# Patient Record
Sex: Male | Born: 1985 | Race: Asian | Hispanic: No | Marital: Single | Smoking: Never smoker
Health system: Southern US, Community
[De-identification: ages and names within clinical notes are randomized; demographics above are authoritative.]

---

## 2016-07-10 ENCOUNTER — Encounter (HOSPITAL_BASED_OUTPATIENT_CLINIC_OR_DEPARTMENT_OTHER): Payer: Self-pay | Admitting: *Deleted

## 2016-07-10 ENCOUNTER — Emergency Department (HOSPITAL_BASED_OUTPATIENT_CLINIC_OR_DEPARTMENT_OTHER): Payer: Self-pay

## 2016-07-10 ENCOUNTER — Inpatient Hospital Stay (HOSPITAL_BASED_OUTPATIENT_CLINIC_OR_DEPARTMENT_OTHER)
Admission: EM | Admit: 2016-07-10 | Discharge: 2016-07-12 | DRG: 378 | Disposition: A | Discharge status: Home / Self Care | Payer: Self-pay | Attending: Internal Medicine | Admitting: Internal Medicine

## 2016-07-10 DIAGNOSIS — K257 Chronic gastric ulcer without hemorrhage or perforation: Secondary | ICD-10-CM | POA: Diagnosis present

## 2016-07-10 DIAGNOSIS — I959 Hypotension, unspecified: Secondary | ICD-10-CM

## 2016-07-10 DIAGNOSIS — D72829 Elevated white blood cell count, unspecified: Secondary | ICD-10-CM

## 2016-07-10 DIAGNOSIS — R718 Other abnormality of red blood cells: Secondary | ICD-10-CM

## 2016-07-10 DIAGNOSIS — W1811XA Fall from or off toilet without subsequent striking against object, initial encounter: Secondary | ICD-10-CM | POA: Diagnosis present

## 2016-07-10 DIAGNOSIS — S01111A Laceration without foreign body of right eyelid and periocular area, initial encounter: Secondary | ICD-10-CM | POA: Diagnosis present

## 2016-07-10 DIAGNOSIS — D62 Acute posthemorrhagic anemia: Secondary | ICD-10-CM

## 2016-07-10 DIAGNOSIS — S0181XA Laceration without foreign body of other part of head, initial encounter: Secondary | ICD-10-CM

## 2016-07-10 DIAGNOSIS — K269 Duodenal ulcer, unspecified as acute or chronic, without hemorrhage or perforation: Secondary | ICD-10-CM | POA: Diagnosis present

## 2016-07-10 DIAGNOSIS — K922 Gastrointestinal hemorrhage, unspecified: Secondary | ICD-10-CM | POA: Diagnosis present

## 2016-07-10 DIAGNOSIS — Y92512 Supermarket, store or market as the place of occurrence of the external cause: Secondary | ICD-10-CM

## 2016-07-10 DIAGNOSIS — K219 Gastro-esophageal reflux disease without esophagitis: Secondary | ICD-10-CM | POA: Diagnosis present

## 2016-07-10 DIAGNOSIS — D649 Anemia, unspecified: Secondary | ICD-10-CM

## 2016-07-10 DIAGNOSIS — R55 Syncope and collapse: Secondary | ICD-10-CM

## 2016-07-10 DIAGNOSIS — K921 Melena: Principal | ICD-10-CM | POA: Diagnosis present

## 2016-07-10 LAB — CBC WITH DIFFERENTIAL/PLATELET
BASOS ABS: 0.1 10*3/uL (ref 0.0–0.1)
BASOS PCT: 1 %
Band Neutrophils: 4 %
EOS ABS: 0.3 10*3/uL (ref 0.0–0.7)
Eosinophils Relative: 2 %
HCT: 30.2 % — ABNORMAL LOW (ref 39.0–52.0)
Hemoglobin: 10.4 g/dL — ABNORMAL LOW (ref 13.0–17.0)
LYMPHS PCT: 24 %
Lymphs Abs: 3.2 10*3/uL (ref 0.7–4.0)
MCH: 29.8 pg (ref 26.0–34.0)
MCHC: 34.4 g/dL (ref 30.0–36.0)
MCV: 86.5 fL (ref 78.0–100.0)
MONO ABS: 0.8 10*3/uL (ref 0.1–1.0)
Monocytes Relative: 6 %
NEUTROS PCT: 63 %
Neutro Abs: 9 10*3/uL — ABNORMAL HIGH (ref 1.7–7.7)
PLATELETS: 206 10*3/uL (ref 150–400)
RBC: 3.49 MIL/uL — ABNORMAL LOW (ref 4.22–5.81)
RDW: 12.6 % (ref 11.5–15.5)
WBC: 13.4 10*3/uL — ABNORMAL HIGH (ref 4.0–10.5)

## 2016-07-10 LAB — HEMOGLOBIN AND HEMATOCRIT, BLOOD
HCT: 24.3 % — ABNORMAL LOW (ref 39.0–52.0)
HEMATOCRIT: 26.9 % — AB (ref 39.0–52.0)
HEMOGLOBIN: 9.1 g/dL — AB (ref 13.0–17.0)
Hemoglobin: 8.3 g/dL — ABNORMAL LOW (ref 13.0–17.0)

## 2016-07-10 LAB — COMPREHENSIVE METABOLIC PANEL
ALK PHOS: 54 U/L (ref 38–126)
ALT: 31 U/L (ref 17–63)
ANION GAP: 5 (ref 5–15)
AST: 28 U/L (ref 15–41)
Albumin: 3.5 g/dL (ref 3.5–5.0)
BUN: 35 mg/dL — ABNORMAL HIGH (ref 6–20)
CALCIUM: 8.8 mg/dL — AB (ref 8.9–10.3)
CO2: 24 mmol/L (ref 22–32)
Chloride: 107 mmol/L (ref 101–111)
Creatinine, Ser: 0.92 mg/dL (ref 0.61–1.24)
GFR calc non Af Amer: >60 mL/min (ref >60)
Glucose, Bld: 112 mg/dL — ABNORMAL HIGH (ref 65–99)
POTASSIUM: 4.3 mmol/L (ref 3.5–5.1)
SODIUM: 136 mmol/L (ref 135–145)
Total Bilirubin: 0.6 mg/dL (ref 0.3–1.2)
Total Protein: 6.3 g/dL — ABNORMAL LOW (ref 6.5–8.1)

## 2016-07-10 LAB — TROPONIN I

## 2016-07-10 LAB — URINALYSIS, ROUTINE W REFLEX MICROSCOPIC
Bilirubin Urine: NEGATIVE
Glucose, UA: NEGATIVE mg/dL
Hgb urine dipstick: NEGATIVE
KETONES UR: 15 mg/dL — AB
LEUKOCYTES UA: NEGATIVE
NITRITE: NEGATIVE
PROTEIN: NEGATIVE mg/dL
Specific Gravity, Urine: 1.03 (ref 1.005–1.030)
pH: 6 (ref 5.0–8.0)

## 2016-07-10 LAB — OCCULT BLOOD X 1 CARD TO LAB, STOOL: Fecal Occult Bld: POSITIVE — AB

## 2016-07-10 MED ORDER — TETANUS-DIPHTH-ACELL PERTUSSIS 5-2.5-18.5 LF-MCG/0.5 IM SUSP
0.5000 mL | Freq: Once | INTRAMUSCULAR | Status: AC
  Administered 2016-07-10: 0.5 mL via INTRAMUSCULAR
  Filled 2016-07-10: qty 0.5

## 2016-07-10 MED ORDER — LIDOCAINE-EPINEPHRINE (PF) 2 %-1:200000 IJ SOLN
20.0000 mL | Freq: Once | INTRAMUSCULAR | Status: AC
  Administered 2016-07-10: 20 mL
  Filled 2016-07-10: qty 20

## 2016-07-10 MED ORDER — SODIUM CHLORIDE 0.9 % IV SOLN
80.0000 mg | Freq: Once | INTRAVENOUS | Status: AC
  Administered 2016-07-10: 80 mg via INTRAVENOUS
  Filled 2016-07-10: qty 80

## 2016-07-10 MED ORDER — SODIUM CHLORIDE 0.9 % IV BOLUS (SEPSIS)
1000.0000 mL | Freq: Once | INTRAVENOUS | Status: AC
  Administered 2016-07-10: 1000 mL via INTRAVENOUS

## 2016-07-10 MED ORDER — HYDROCODONE-ACETAMINOPHEN 5-325 MG PO TABS: 1.0000 | ORAL_TABLET | ORAL | Status: DC | PRN

## 2016-07-10 MED ORDER — FAMOTIDINE IN NACL 20-0.9 MG/50ML-% IV SOLN
20.0000 mg | Freq: Two times a day (BID) | INTRAVENOUS | Status: DC
  Administered 2016-07-10: 20 mg via INTRAVENOUS
  Filled 2016-07-10 (×2): qty 50

## 2016-07-10 MED ORDER — SODIUM CHLORIDE 0.9 % IV SOLN
8.0000 mg/h | INTRAVENOUS | Status: DC
  Administered 2016-07-10 – 2016-07-11 (×3): 8 mg/h via INTRAVENOUS
  Filled 2016-07-10 (×6): qty 80

## 2016-07-10 MED ORDER — LIDOCAINE-EPINEPHRINE-TETRACAINE (LET) SOLUTION
3.0000 mL | Freq: Once | NASAL | Status: AC
  Administered 2016-07-10: 3 mL via TOPICAL
  Filled 2016-07-10: qty 3

## 2016-07-10 MED ORDER — ACETAMINOPHEN 325 MG PO TABS: 650.0000 mg | ORAL_TABLET | Freq: Four times a day (QID) | ORAL | Status: DC | PRN

## 2016-07-10 MED ORDER — SODIUM CHLORIDE 0.9 % IV SOLN
INTRAVENOUS | Status: DC
  Administered 2016-07-10 – 2016-07-12 (×4): via INTRAVENOUS

## 2016-07-10 MED ORDER — SODIUM CHLORIDE 0.9 % IV SOLN: INTRAVENOUS | Status: DC

## 2016-07-10 MED ORDER — MAGNESIUM CITRATE PO SOLN: 1.0000 | Freq: Once | ORAL | Status: DC | PRN

## 2016-07-10 MED ORDER — ONDANSETRON HCL 4 MG PO TABS: 4.0000 mg | ORAL_TABLET | Freq: Four times a day (QID) | ORAL | Status: DC | PRN

## 2016-07-10 MED ORDER — SENNOSIDES-DOCUSATE SODIUM 8.6-50 MG PO TABS: 1.0000 | ORAL_TABLET | Freq: Every evening | ORAL | Status: DC | PRN

## 2016-07-10 MED ORDER — BISACODYL 10 MG RE SUPP: 10.0000 mg | Freq: Every day | RECTAL | Status: DC | PRN

## 2016-07-10 MED ORDER — ACETAMINOPHEN 650 MG RE SUPP: 650.0000 mg | Freq: Four times a day (QID) | RECTAL | Status: DC | PRN

## 2016-07-10 MED ORDER — ONDANSETRON HCL 4 MG/2ML IJ SOLN: 4.0000 mg | Freq: Four times a day (QID) | INTRAMUSCULAR | Status: DC | PRN

## 2016-07-10 NOTE — ED Notes (Signed)
PT STATES HE DID FEEL DIZZY WHEN HE STOOD UP.

## 2016-07-10 NOTE — ED Notes (Signed)
Pt made aware of the need for a urine sample, says he will void shortly.Urinal given at this time.

## 2016-07-10 NOTE — ED Provider Notes (Signed)
[facility] Note   CSN: [id number] Arrival date & time: 07/10/16  1005     History   Chief Complaint Chief Complaint  Patient presents with  . Loss of Consciousness    HPI Aaron Dalton is a 30 y.o. male.  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  He said that he's had black stool for the past few days.  The pt has never had anything like this in the past.  The pt said that he was in the bathroom after having a black stool; he was cleaning himself up; then passed out.  Pt did sustain a lac to his forehead when he passed out.        History reviewed. No pertinent past medical history.  Patient Active Problem List   Diagnosis Date Noted  . GI bleed 07/10/2016    History reviewed. No pertinent surgical history.     Home Medications    Prior to Admission medications   Not on File    Family History No family history on file.  Social History Social History  Substance Use Topics  . Smoking status: Never Smoker  . Smokeless tobacco: Never Used  . Alcohol use Yes     Comment: several times per month     Allergies   Review of patient's allergies indicates no known allergies.   Review of Systems Review of Systems  Gastrointestinal: Positive for nausea.       Black stool  Skin: Positive for wound.  Neurological: Positive for syncope.  All other systems reviewed and are negative.    Physical Exam Updated Vital Signs BP 124/71   Pulse 84   Temp 98.4 F (36.9 C) (Oral)   Resp 17   Ht 5\' 4"  (1.626 m)   Wt 189 lb (85.7 kg)   SpO2 100%   BMI 32.44 kg/m   Physical Exam  Constitutional: He is oriented to person, place, and time. He appears well-developed and well-nourished.  HENT:  Head: Normocephalic. Head is with laceration.    Right Ear: External ear normal.  Left Ear: External ear normal.  Nose: Nose normal.  Mouth/Throat: Oropharynx is clear and moist.  Eyes: Conjunctivae and EOM are normal.  Pupils are equal, round, and reactive to light.  Neck: Normal range of motion. Neck supple.  Cardiovascular: Normal rate, regular rhythm, normal heart sounds and intact distal pulses.   Pulmonary/Chest: Effort normal and breath sounds normal.  Abdominal: Soft. Bowel sounds are normal.  Genitourinary: Rectum normal, prostate normal and penis normal.  Genitourinary Comments: Stool is black  Musculoskeletal: Normal range of motion.  Neurological: He is alert and oriented to person, place, and time.  Skin: Skin is warm and dry.  Psychiatric: He has a normal mood and affect. His behavior is normal. Judgment and thought content normal.  Nursing note and vitals reviewed.    ED Treatments / Results  Labs (all labs ordered are listed, but only abnormal results are displayed) Labs Reviewed  COMPREHENSIVE METABOLIC PANEL - Abnormal; Notable for the following:       Result Value   Glucose, Bld 112 (*)    BUN 35 (*)    Calcium 8.8 (*)    Total Protein 6.3 (*)    All other components within normal limits  CBC WITH DIFFERENTIAL/PLATELET - Abnormal; Notable for the following:    WBC 13.4 (*)    RBC 3.49 (*)    Hemoglobin 10.4 (*)  HCT 30.2 (*)    Neutro Abs 9.0 (*)    All other components within normal limits  URINALYSIS, ROUTINE W REFLEX MICROSCOPIC (NOT AT [facility]) - Abnormal; Notable for the following:    Ketones, ur 15 (*)    All other components within normal limits  OCCULT BLOOD X 1 CARD TO LAB, STOOL - Abnormal; Notable for the following:    Fecal Occult Bld POSITIVE (*)    All other components within normal limits  TROPONIN I  POC OCCULT BLOOD, ED    EKG  EKG Interpretation  Date/Time:  Saturday July 10 2016 10:24:15 EDT Ventricular Rate:  78 PR Interval:    QRS Duration: 81 QT Interval:  367 QTC Calculation: 418 R Axis:   61 Text Interpretation:  Sinus rhythm Confirmed by Justyce Baby MD, Burnard Enis ([id number]) on 07/10/2016 10:32:27 AM       Radiology Ct Head Wo  Contrast  Result Date: 07/10/2016 CLINICAL DATA:  Fall today EXAM: CT HEAD WITHOUT CONTRAST TECHNIQUE: Contiguous axial images were obtained from the base of the skull through the vertex without intravenous contrast. COMPARISON:  None. FINDINGS: Brain: No intracranial hemorrhage, mass effect or midline shift. No acute cortical infarction. No mass lesion is noted on this unenhanced scan. Vascular: No hyperdense vessel or unexpected calcification. Skull: Normal. Negative for fracture or focal lesion. There is skin irregularity probable laceration right frontal supraorbital scalp. Sinuses/Orbits: No acute finding. Other: None. IMPRESSION: No acute intracranial abnormality. Probable skin laceration right frontal supraorbital region. Electronically Signed   By: Natasha MeadLiviu  Pop M.D.   On: 07/10/2016 11:43    Procedures .Marland Kitchen.Laceration Repair Date/Time: 07/10/2016 11:56 AM Performed by: Jacalyn LefevreHAVILAND, Viviann Broyles Authorized by: Jacalyn LefevreHAVILAND, Lu Paradise   Consent:    Consent obtained:  Verbal   Consent given by:  Patient   Risks discussed:  Infection, pain, poor cosmetic result and need for additional repair   Alternatives discussed:  No treatment Anesthesia (see MAR for exact dosages):    Anesthesia method:  Local infiltration and topical application   Topical anesthetic:  LET   Local anesthetic:  Lidocaine 2% WITH epi Laceration details:    Location:  Face   Face location:  R eyebrow   Length (cm):  3 Repair type:    Repair type:  Simple Pre-procedure details:    Preparation:  Patient was prepped and draped in usual sterile fashion Exploration:    Hemostasis achieved with:  Direct pressure   Contaminated: no   Treatment:    Area cleansed with:  Betadine   Amount of cleaning:  Standard   Irrigation solution:  Sterile saline Skin repair:    Repair method:  Sutures   Suture size:  6-0   Suture material:  Prolene   Number of sutures:  8 Approximation:    Approximation:  Close   Vermilion border: well-aligned    Post-procedure details:    Dressing:  Antibiotic ointment   Patient tolerance of procedure:  Tolerated well, no immediate complications   (including critical care time)  Medications Ordered in ED Medications  famotidine (PEPCID) IVPB 20 mg premix (20 mg Intravenous New Bag/Given 07/10/16 1141)  sodium chloride 0.9 % bolus 1,000 mL (not administered)  sodium chloride 0.9 % bolus 1,000 mL (0 mLs Intravenous Stopped 07/10/16 1138)  Tdap (BOOSTRIX) injection 0.5 mL (0.5 mLs Intramuscular Given 07/10/16 1104)  lidocaine-EPINEPHrine-tetracaine (LET) solution (3 mLs Topical Given 07/10/16 1107)  lidocaine-EPINEPHrine (XYLOCAINE W/EPI) 2 %-1:200000 (PF) injection 20 mL (20 mLs Infiltration Given by Other 07/10/16 1054)  Initial Impression / Assessment and Plan / ED Course  I have reviewed the triage vital signs and the nursing notes.  Pertinent labs & imaging results that were available during my care of the patient were reviewed by me and considered in my medical decision making (see chart for details).  Clinical Course   Pt with symptomatic anemia and orthostatic with a GI bleed.  He has no PCP and no f/u.   Pt d/w Dr. York Spaniel ([facility]) who [facility] stay.     Final Clinical Impressions(s) / ED Diagnoses   Final diagnoses:  Syncope, unspecified syncope type  Anemia, unspecified anemia type  Polychromasia  Gastrointestinal hemorrhage, unspecified gastritis, unspecified gastrointestinal hemorrhage type  Hypotension, unspecified hypotension type  Facial laceration, initial encounter    New Prescriptions New Prescriptions   No medications on file     Jacalyn Lefevre, MD 07/10/16 1211

## 2016-07-10 NOTE — ED Triage Notes (Signed)
Pt reports around 1000 he was in the bathroom [place] the [employer] and was cleaning himself after passing a black stool when he became dizzy. States next thing he remembers is waking up on the floor. Reports black stools since around Tuesday or Wednesday. Reports nausea. Denies v/d. Pt has approx 3cm lac at R eyebrow. Pt reports still feeling light-headed but denies vision changes.

## 2016-07-10 NOTE — ED Notes (Signed)
Family at bedside. 

## 2016-07-10 NOTE — ED Notes (Signed)
MD at bedside. 

## 2016-07-10 NOTE — Consult Note (Signed)
Referring Provider: Dr. York Spaniel Primary Care Physician:  No PCP Per Patient Primary Gastroenterologist:  None   Reason for Consultation:  GI Bleed  HPI: Aaron Dalton is a 30 y.o. male was transferred to [facility] from [facility] ER for further evaluation of GI bleed. As per patient he started noticing black tarry stool on Tuesday. Usually 1 bowel movement per day. Patient did not had any symptoms until this morning when he started feeling weakness and lightheadedness. [place] patient had 2 episodes of syncope. Patient was taken to [facility] by family member and was transferred here for further GI evaluation. Patient denied any abdominal pain. Noticed acid reflux symptoms today. Denied any weight loss or change in appetite. No excessive NSAIDs use. No prior endoscopy. No family history gastric cancer. No prior endoscopy. Denied any bright red blood per rectum. Denied diarrhea. Denied constipation. Denied dysphagia or odynophagia.  History reviewed. No pertinent past medical history.  History reviewed. No pertinent surgical history.  Prior to Admission medications   Medication Sig Start Date End Date Taking? Authorizing Provider  aspirin-acetaminophen-caffeine (EXCEDRIN MIGRAINE) [phone] MG tablet Take 1 tablet by mouth every 8 (eight) hours as needed for headache.   Yes Historical Provider, MD  loratadine-pseudoephedrine (CLARITIN-D 12-HOUR) 5-120 MG tablet Take 1 tablet by mouth 2 (two) times daily as needed for allergies.   Yes Historical Provider, MD    Scheduled Meds:  Continuous Infusions: . sodium chloride 100 mL/hr at 07/10/16 1530  . pantoprozole (PROTONIX) infusion 8 mg/hr (07/10/16 1601)   PRN Meds:.acetaminophen **OR** acetaminophen, bisacodyl, HYDROcodone-acetaminophen, magnesium citrate, ondansetron **OR** ondansetron (ZOFRAN) IV, senna-docusate  Allergies as of 07/10/2016  . (No Known Allergies)    History reviewed. No  pertinent family history.  Social History   Social History  . Marital status: Single    Spouse name: N/A  . Number of children: N/A  . Years of education: N/A   Occupational History  . Not on file.   Social History Main Topics  . Smoking status: Never Smoker  . Smokeless tobacco: Never Used  . Alcohol use Yes     Comment: several times per month  . Drug use: No  . Sexual activity: Not on file   Other Topics Concern  . Not on file   Social History Narrative  . No narrative on file    Review of Systems: All negative except as stated above in HPI.  Physical Exam: Vital signs: Vitals:   07/10/16 1313 07/10/16 1408  BP: 111/67 111/70  Pulse: 84 90  Resp: 14 17  Temp: 98.5 F (36.9 C) 98.3 F (36.8 C)   Last BM Date: 07/11/16 General:   Alert,  Well-developed, well-nourished, pleasant and cooperative in NAD Lungs:  Clear throughout to auscultation.   No wheezes, crackles, or rhonchi. No acute distress. Heart:  Regular rate and rhythm; no murmurs, clicks, rubs,  or gallops. Abdomen: Soft, nontender, nondistended, bowel sounds present Lower activity. No edema Rectal:  Deferred  GI:  Lab Results:  Recent Labs  07/10/16 1050 07/10/16 1532  WBC 13.4*  --   HGB 10.4* 9.1*  HCT 30.2* 26.9*  PLT 206  --    BMET  Recent Labs  07/10/16 1050  NA 136  K 4.3  CL 107  CO2 24  GLUCOSE 112*  BUN 35*  CREATININE 0.92  CALCIUM 8.8*   LFT  Recent Labs  07/10/16 1050  PROT 6.3*  ALBUMIN 3.5  AST 28  ALT 31  ALKPHOS 54  BILITOT 0.6   PT/INR No results for input(s): LABPROT, INR in the last 72 hours.   Studies/Results: Ct Head Wo Contrast  Result Date: 07/10/2016 CLINICAL DATA:  Fall today EXAM: CT HEAD WITHOUT CONTRAST TECHNIQUE: Contiguous axial images were obtained from the base of the skull through the vertex without intravenous contrast. COMPARISON:  None. FINDINGS: Brain: No intracranial hemorrhage, mass effect or midline shift. No acute cortical  infarction. No mass lesion is noted on this unenhanced scan. Vascular: No hyperdense vessel or unexpected calcification. Skull: Normal. Negative for fracture or focal lesion. There is skin irregularity probable laceration right frontal supraorbital scalp. Sinuses/Orbits: No acute finding. Other: None. IMPRESSION: No acute intracranial abnormality. Probable skin laceration right frontal supraorbital region. Electronically Signed   By: Natasha MeadLiviu  Pop M.D.   On: 07/10/2016 11:43    Impression/Plan: Melena. Acute blood loss anemia Occult blood positive stool - probably from upper GI bleed   Recommendations ------------------------- Recommend EGD for further evaluation. Patient just had his dinner. Plan for EGD tomorrow. Continue Protonix drip for now Monitor H&H. Transfuse if hemoglobin less than 7. GI will follow    LOS: 0 days   Tracye Szuch  07/10/2016, 7:48 PM  Pager [phone] If no answer or after 5 PM call [phone]

## 2016-07-10 NOTE — H&P (Signed)
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 He had not prior similar episodes. He reports balck tarry stools over the last 5 days. He denies recent chest pain on exertion,shortness of breath on minimal exertion, pre-syncopal episodes, or palpitations. He had not noticed any other bleeding such as epistaxis, hematuria or hematochezia. Never had a colonoscopy or EGD. No history of Gastric or duodenal ulcers. Denies  NSAID ingestion. He is not on antiplatelets agents.He had no prior history or diagnosis of GI cancer. Patient has never been evaluated for anemia by a hematologist. He denies any pica and eats a variety of diet. He never donated blood or received blood transfusion. The patient never received IV Iron.He denies risk factors for HIV or hepatitis.   Interim Course:  BP 111/70 (BP Location: Left Arm)   Pulse 90   Temp 98.3 F (36.8 C)   Resp 17   Ht 5\' 4"  (1.626 m)   Wt 87.2 kg (192 lb 3.2 oz)   SpO2 100%   BMI 32.99 kg/m   WBC 13.4, Hb 10.4, plts 206, BUN 35 Ca 8.8  Glu 112 Ketones 15 Hemoccult positive  EKG SR  No ACS  Received Pepcid IV and IVF   Review of Systems: As per HPI otherwise 10 point review of systems negative.   History reviewed. No pertinent past medical history.  History reviewed. No pertinent surgical history.  Social History Social History   Social History  . Marital status: Single    Spouse name: N/A  . Number of children: N/A  . Years of education: N/A   Occupational History  . Not on file.   Social History Main Topics  . Smoking status: Never Smoker  . Smokeless  tobacco: Never Used  . Alcohol use Yes     Comment: several times per month  . Drug use: No  . Sexual activity: Not on file   Other Topics Concern  . Not on file   Social History Narrative  . No narrative on file     No Known Allergies  History reviewed. No pertinent family history.    Prior to Admission medications   Not on File    Physical Exam:    Vitals:   07/10/16 1213 07/10/16 1313 07/10/16 1408 07/10/16 1414  BP: 121/79 111/67 111/70   Pulse: 84 84 90   Resp: 21 14 17    Temp:  98.5 F (36.9 C) 98.3 F (36.8 C)   TempSrc:  Oral    SpO2: 100% 100% 100%   Weight:    87.2 kg (192 lb 3.2 oz)  Height:    5\' 4"  (1.626 m)       Constitutional: NAD, calm, comfortable  Vitals:   07/10/16 1213 07/10/16 1313 07/10/16 1408 07/10/16 1414  BP: 121/79 111/67 111/70   Pulse: 84 84 90   Resp: 21 14 17    Temp:  98.5 F (36.9 C) 98.3 F (36.8  C)   TempSrc:  Oral    SpO2: 100% 100% 100%   Weight:    87.2 kg (192 lb 3.2 oz)  Height:    5\' 4"  (1.626 m)   Eyes: PERRL, lids and conjunctivae normal ENMT: Mucous membranes are moist. Posterior pharynx clear of any exudate or lesions.Normal dentition.  Right eyebrow laceration with stitches  Neck: normal, supple, no masses, no thyromegaly Respiratory: clear to auscultation bilaterally, no wheezing, no crackles. Normal respiratory effort. No accessory muscle use.  Cardiovascular: Regular rate and rhythm, no murmurs / rubs / gallops. No extremity edema. 2+ pedal pulses. No carotid bruits.  Abdomen: no tenderness, no masses palpated. No hepatosplenomegaly. Bowel sounds positive.  Musculoskeletal: no clubbing / cyanosis. No joint deformity upper and lower extremities. Good ROM, no contractures. Normal muscle tone.  Skin: no rashes, lesions, ulcers.  Neurologic: CN 2-12 grossly intact. Sensation intact, DTR normal. Strength 5/5 in all 4.  Psychiatric: Normal judgment and insight. Alert and oriented x 3. Normal mood.      Labs on Admission: I have personally reviewed following labs and imaging studies  CBC:  Recent Labs Lab 07/10/16 1050  WBC 13.4*  NEUTROABS 9.0*  HGB 10.4*  HCT 30.2*  MCV 86.5  PLT 206    Basic Metabolic Panel:  Recent Labs Lab 07/10/16 1050  NA 136  K 4.3  CL 107  CO2 24  GLUCOSE 112*  BUN 35*  CREATININE 0.92  CALCIUM 8.8*    GFR: Estimated Creatinine Clearance: 115.8 mL/min (by C-G formula based on SCr of 0.92 mg/dL).  Liver Function Tests:  Recent Labs Lab 07/10/16 1050  AST 28  ALT 31  ALKPHOS 54  BILITOT 0.6  PROT 6.3*  ALBUMIN 3.5   No results for input(s): LIPASE, AMYLASE in the last 168 hours. No results for input(s): AMMONIA in the last 168 hours.  Coagulation Profile: No results for input(s): INR, PROTIME in the last 168 hours.  Cardiac Enzymes:  Recent Labs Lab 07/10/16 1050  TROPONINI <0.03    BNP (last 3 results) No results for input(s): PROBNP in the last 8760 hours.  HbA1C: No results for input(s): HGBA1C in the last 72 hours.  CBG: No results for input(s): GLUCAP in the last 168 hours.  Lipid Profile: No results for input(s): CHOL, HDL, LDLCALC, TRIG, CHOLHDL, LDLDIRECT in the last 72 hours.  Thyroid Function Tests: No results for input(s): TSH, T4TOTAL, FREET4, T3FREE, THYROIDAB in the last 72 hours.  Anemia Panel: No results for input(s): VITAMINB12, FOLATE, FERRITIN, TIBC, IRON, RETICCTPCT in the last 72 hours.  Urine analysis:    Component Value Date/Time   COLORURINE YELLOW 07/10/2016 1050   APPEARANCEUR CLEAR 07/10/2016 1050   LABSPEC 1.030 07/10/2016 1050   PHURINE 6.0 07/10/2016 1050   GLUCOSEU NEGATIVE 07/10/2016 1050   HGBUR NEGATIVE 07/10/2016 1050   BILIRUBINUR NEGATIVE 07/10/2016 1050   KETONESUR 15 (A) 07/10/2016 1050   PROTEINUR NEGATIVE 07/10/2016 1050   NITRITE NEGATIVE 07/10/2016 1050   LEUKOCYTESUR NEGATIVE 07/10/2016 1050    Sepsis  Labs: @LABRCNTIP (procalcitonin:4,lacticidven:4) )No results found for this or any previous visit (from the past 240 hour(s)).   Radiological Exams on Admission: Ct Head Wo Contrast  Result Date: 07/10/2016 CLINICAL DATA:  Fall today EXAM: CT HEAD WITHOUT CONTRAST TECHNIQUE: Contiguous axial images were obtained from the base of the skull through the vertex without intravenous contrast. COMPARISON:  None. FINDINGS: Brain: No intracranial hemorrhage, mass effect or midline shift. No acute cortical infarction. No mass  lesion is noted on this unenhanced scan. Vascular: No hyperdense vessel or unexpected calcification. Skull: Normal. Negative for fracture or focal lesion. There is skin irregularity probable laceration right frontal supraorbital scalp. Sinuses/Orbits: No acute finding. Other: None. IMPRESSION: No acute intracranial abnormality. Probable skin laceration right frontal supraorbital region. Electronically Signed   By: Natasha Mead M.D.   On: 07/10/2016 11:43    EKG: Independently reviewed.  Assessment/Plan Active Problems:   GI bleed   Syncope and collapse  Gastro Intestinal Bleed with anemia of blood loss , Likely higher GI . BUN 35 . HB 10.4, no BL HB to compare. Received IV Protonix in the ED  . Hcult positive Will type and screen, check serial CBCs, and transfuse for Hgb less than 8.  Continue PPI  80 mg bolus and then 8 mg/h  IVF  Liquid diet, NPO after midnight Dr. Cecelia Byars  Gastroenterology has been consulted.   Leukocytosis, likely reactive, related to inflammation. WBC 13 , Afebrile  IVF  Cultures   Repeat CBC in AM  Syncopal episode with collapse without recurrence, likely due to GIB/OH. CT head negative  EKG SR, BP is in the 100s/60's after  IVF Continue to monitor Continue IVF Check orthostatics     DVT prophylaxis: SCDS Code Status:   Full    Family Communication:  Discussed with patient Disposition Plan: Expect patient to be discharged to home after condition  improves Consults called:    GI  Admission status:Tele  Obs   [facility], PA-C [facility]   If 7PM-7AM, please contact night-coverage [email or web address] Password [email or web address]  07/10/2016, 2:27 PM

## 2016-07-11 ENCOUNTER — Encounter (HOSPITAL_COMMUNITY): Payer: Self-pay | Admitting: *Deleted

## 2016-07-11 ENCOUNTER — Encounter (HOSPITAL_COMMUNITY)
Admission: EM | Disposition: A | Discharge status: Home / Self Care | Payer: Self-pay | Source: Home / Self Care | Attending: Internal Medicine

## 2016-07-11 DIAGNOSIS — D72829 Elevated white blood cell count, unspecified: Secondary | ICD-10-CM

## 2016-07-11 DIAGNOSIS — K264 Chronic or unspecified duodenal ulcer with hemorrhage: Secondary | ICD-10-CM

## 2016-07-11 HISTORY — PX: ESOPHAGOGASTRODUODENOSCOPY: SHX5428

## 2016-07-11 LAB — COMPREHENSIVE METABOLIC PANEL
ALBUMIN: 2.6 g/dL — AB (ref 3.5–5.0)
ALK PHOS: 45 U/L (ref 38–126)
ALT: 25 U/L (ref 17–63)
ANION GAP: 5 (ref 5–15)
AST: 21 U/L (ref 15–41)
BUN: 14 mg/dL (ref 6–20)
CALCIUM: 8 mg/dL — AB (ref 8.9–10.3)
CO2: 24 mmol/L (ref 22–32)
Chloride: 112 mmol/L — ABNORMAL HIGH (ref 101–111)
Creatinine, Ser: 0.98 mg/dL (ref 0.61–1.24)
GFR calc Af Amer: >60 mL/min (ref >60)
GFR calc non Af Amer: >60 mL/min (ref >60)
GLUCOSE: 100 mg/dL — AB (ref 65–99)
Potassium: 3.8 mmol/L (ref 3.5–5.1)
SODIUM: 141 mmol/L (ref 135–145)
Total Bilirubin: 0.5 mg/dL (ref 0.3–1.2)
Total Protein: 4.8 g/dL — ABNORMAL LOW (ref 6.5–8.1)

## 2016-07-11 LAB — CBC
HCT: 24.2 % — ABNORMAL LOW (ref 39.0–52.0)
HEMOGLOBIN: 8 g/dL — AB (ref 13.0–17.0)
MCH: 29 pg (ref 26.0–34.0)
MCHC: 33.1 g/dL (ref 30.0–36.0)
MCV: 87.7 fL (ref 78.0–100.0)
Platelets: 170 10*3/uL (ref 150–400)
RBC: 2.76 MIL/uL — ABNORMAL LOW (ref 4.22–5.81)
RDW: 13.1 % (ref 11.5–15.5)
WBC: 7.8 10*3/uL (ref 4.0–10.5)

## 2016-07-11 LAB — HEMOGLOBIN AND HEMATOCRIT, BLOOD
HCT: 25.6 % — ABNORMAL LOW (ref 39.0–52.0)
HEMOGLOBIN: 8.4 g/dL — AB (ref 13.0–17.0)

## 2016-07-11 LAB — PROTIME-INR
INR: 1.13
PROTHROMBIN TIME: 14.6 s (ref 11.4–15.2)

## 2016-07-11 SURGERY — EGD (ESOPHAGOGASTRODUODENOSCOPY)
Anesthesia: Moderate Sedation

## 2016-07-11 MED ORDER — FENTANYL CITRATE (PF) 100 MCG/2ML IJ SOLN
INTRAMUSCULAR | Status: DC | PRN
  Administered 2016-07-11 (×2): 25 ug via INTRAVENOUS

## 2016-07-11 MED ORDER — SUCRALFATE 1 GM/10ML PO SUSP
1.0000 g | Freq: Three times a day (TID) | ORAL | Status: DC
  Administered 2016-07-11 – 2016-07-12 (×4): 1 g via ORAL
  Filled 2016-07-11 (×4): qty 10

## 2016-07-11 MED ORDER — PANTOPRAZOLE SODIUM 40 MG PO TBEC
40.0000 mg | DELAYED_RELEASE_TABLET | Freq: Two times a day (BID) | ORAL | Status: DC
  Administered 2016-07-11 – 2016-07-12 (×2): 40 mg via ORAL
  Filled 2016-07-11 (×2): qty 1

## 2016-07-11 MED ORDER — DIPHENHYDRAMINE HCL 50 MG/ML IJ SOLN
INTRAMUSCULAR | Status: AC
  Filled 2016-07-11: qty 1

## 2016-07-11 MED ORDER — MIDAZOLAM HCL 5 MG/ML IJ SOLN
INTRAMUSCULAR | Status: AC
  Filled 2016-07-11: qty 2

## 2016-07-11 MED ORDER — POLYSACCHARIDE IRON COMPLEX 150 MG PO CAPS
150.0000 mg | ORAL_CAPSULE | Freq: Every day | ORAL | Status: DC
  Administered 2016-07-11 – 2016-07-12 (×2): 150 mg via ORAL
  Filled 2016-07-11 (×3): qty 1

## 2016-07-11 MED ORDER — MIDAZOLAM HCL 10 MG/2ML IJ SOLN
INTRAMUSCULAR | Status: DC | PRN
  Administered 2016-07-11 (×3): 2 mg via INTRAVENOUS

## 2016-07-11 MED ORDER — FENTANYL CITRATE (PF) 100 MCG/2ML IJ SOLN
INTRAMUSCULAR | Status: AC
  Filled 2016-07-11: qty 2

## 2016-07-11 NOTE — Progress Notes (Signed)
OT Cancellation Note  Patient Details Name: Mabeline Carasanya Gayton MRN: [id number] DOB: February 05, 1986   Cancelled Treatment:    Reason Eval/Treat Not Completed: Patient at procedure or test/ unavailable.  Gaye AlkenBailey A Lanard Arguijo M.S., OTR/L Pager: [phone]  07/11/2016, 10:09 AM

## 2016-07-11 NOTE — Progress Notes (Signed)
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 He had not prior similar episodes. He reports black tarry stools over the last 5 days. He denies recent chest pain on exertion, shortness of breath on minimal exertion, pre-syncopal episodes, or palpitations. He had not noticed any other bleeding such as epistaxis, hematuria or hematochezia. Never had a colonoscopy or EGD. No history of Gastric or duodenal ulcers in the past. Reported use of excedrin for HA's . He is not on antiplatelets agents. He had no prior history or diagnosis of GI cancer. Patient has never been evaluated for anemia by a hematologist. He denies any pica and eats a variety of diet. He never donated blood or received blood transfusion. He denies risk factors for HIV or hepatitis.   Assessment/Plan: 1-GI bleed: upper GI bleed -status post EGD demonstrating multiple duodenal ulcers and also gastric ulcers -no signs of active bleeding currently -biopsies taken and also testing for H. Pylori -will continue PPI twice a day and follow Hgb trend -will start tx with niferex -ok to advance diet as per GI recommendations -no NSAID's  2-acute blood loss anemia -due to #1 -will follow Hgb trend -started on niferex -no need for transfusion currently   3-syncope: -Likely due to GI bleed -neg troponin, no abnormalities on EKG or telemetry -neg orthostatic changes  -will change IVF's to maintenance -patient diet advanced   4-leukocytosis -due to demargination stress -no signs of infection   5-right side forehead laceration -status post stiches in ED -looking good and w/o sign sof infection    Code Status: Full code Family Communication:  girlfriend at bedside  Disposition Plan: anticipate discharge home in am if remains stable. Will advance diet and transition PPI to PO twice a day.    Consultants:  GI service   Procedures:  EGD: - Normal esophagus. - Gastric ulcer with no stigmata of bleeding. Biopsied. - Multiple duodenal ulcers.  Antibiotics:  None   HPI/Subjective: Afebrile, denies chest pain and SOB. Patient reports no further black tarry stools and denies any further syncope events   Objective: Vitals:   07/11/16 1045 07/11/16 1109  BP: 111/72 104/63  Pulse: 85 79  Resp: 20   Temp:      Intake/Output Summary (Last 24 hours) at 07/11/16 1225 Last data filed at 07/11/16 0645  Gross per 24 hour  Intake          3233.33 ml  Output             1775 ml  Net          1458.33 ml   Filed Weights   07/10/16 1414 07/11/16 0621 07/11/16 0939  Weight: 87.2 kg (192 lb 3.2 oz) 87.5 kg (193 lb) 87.5 kg (193 lb)    Exam:   General:  Afebrile, no CP, no lightheadedness, dizziness and/or palpitations. Reports no further black stools  Cardiovascular:S1 and S2, no abnormalities on telemetry. No murmurs, no gallops   Respiratory: CTA bilaterally  Abdomen: soft, NT, ND, positive BS  Musculoskeletal: no edema or cyanosis   Skin: right eyebrow/forehead with fresh cutting wound and stiches in place; no signs of infection.   Data Reviewed: Basic Metabolic Panel:  Recent Labs Lab 07/10/16 1050 07/11/16 0622  NA 136 141  K 4.3 3.8  CL 107 112*  CO2 24 24  GLUCOSE 112* 100*  BUN 35* 14  CREATININE 0.92 0.98  CALCIUM 8.8* 8.0*   Liver Function Tests:  Recent Labs Lab 07/10/16 1050 07/11/16 0622  AST 28 21  ALT 31 25  ALKPHOS 54 45  BILITOT 0.6 0.5  PROT 6.3* 4.8*  ALBUMIN 3.5 2.6*   CBC:  Recent Labs Lab 07/10/16 1050 07/10/16 1532 07/10/16 2314 07/11/16 0622 07/11/16 0853  WBC 13.4*  --   --  7.8  --   NEUTROABS 9.0*  --   --   --   --   HGB 10.4* 9.1* 8.3* 8.0* 8.4*  HCT  30.2* 26.9* 24.3* 24.2* 25.6*  MCV 86.5  --   --  87.7  --   PLT 206  --   --  170  --    Cardiac Enzymes:  Recent Labs Lab 07/10/16 1050  TROPONINI <0.03   Studies: Ct Head Wo Contrast  Result Date: 07/10/2016 CLINICAL DATA:  Fall today EXAM: CT HEAD WITHOUT CONTRAST TECHNIQUE: Contiguous axial images were obtained from the base of the skull through the vertex without intravenous contrast. COMPARISON:  None. FINDINGS: Brain: No intracranial hemorrhage, mass effect or midline shift. No acute cortical infarction. No mass lesion is noted on this unenhanced scan. Vascular: No hyperdense vessel or unexpected calcification. Skull: Normal. Negative for fracture or focal lesion. There is skin irregularity probable laceration right frontal supraorbital scalp. Sinuses/Orbits: No acute finding. Other: None. IMPRESSION: No acute intracranial abnormality. Probable skin laceration right frontal supraorbital region. Electronically Signed   By: Natasha Mead M.D.   On: 07/10/2016 11:43    Scheduled Meds: . iron polysaccharides  150 mg Oral Daily  . pantoprazole  40 mg Oral BID  . sucralfate  1 g Oral TID   Continuous Infusions: . sodium chloride 100 mL/hr at 07/11/16 1138    Active Problems:   GI bleed   Syncope and collapse   Leukocytosis   GIB (gastrointestinal bleeding)    Time spent: 35 minutes    Vassie Loll  [facility] Pager [phone]. If 7PM-7AM, please contact night-coverage at [email or web address], password [facility] 07/11/2016, 12:25 PM  LOS: 0 days

## 2016-07-11 NOTE — Evaluation (Signed)
Occupational Therapy Evaluation and Discharge Patient Details Name: Aaron Dalton MRN: [id number] DOB: [birth year] Today's Date: 07/11/2016    History of Present Illness 30 y.o. male with no significant past medical history, brought by EMS after experiencing a syncopal episode sustaining a right forehead laceration as he hit the floor. He reports black tarry stools over the last 5days.   Clinical Impression   Pt reports he was independent with ADL and mobility PTA. Currently pt at baseline and is independent with ADL and functional mobility. Pt able to perform higher level balance activities and parts of the DGI without unsteadiness or LOB. Pt planning to d/c home with family support. No further acute OT needs identified; signing off at this time. Please re-consult if needs change. Thank you for this referral.    Follow Up Recommendations  No OT follow up    Equipment Recommendations  None recommended by OT    Recommendations for Other Services       Precautions / Restrictions Precautions Precautions: Fall Restrictions Weight Bearing Restrictions: No      Mobility Bed Mobility Overal bed mobility: Independent                Transfers Overall transfer level: Independent Equipment used: None                  Balance Overall balance assessment: Independent                                          ADL Overall ADL's : Independent                                       General ADL Comments: Pt able to perform higher level balance activities and parts of the DGI without unsteadiness or LOB.     Vision Vision Assessment?: No apparent visual deficits   Perception     Praxis      Pertinent Vitals/Pain Pain Assessment: No/denies pain     Hand Dominance Right   Extremity/Trunk Assessment Upper Extremity Assessment Upper Extremity Assessment: Overall WFL for tasks assessed   Lower Extremity Assessment Lower Extremity  Assessment: Overall WFL for tasks assessed   Cervical / Trunk Assessment Cervical / Trunk Assessment: Normal   Communication Communication Communication: No difficulties   Cognition Arousal/Alertness: Awake/alert Behavior During Therapy: WFL for tasks assessed/performed Overall Cognitive Status: Within Functional Limits for tasks assessed                     General Comments       Exercises       Shoulder Instructions      Home Living Family/patient expects to be discharged to:: Private residence Living Arrangements: Parent;Other relatives Available Help at Discharge: Family Type of Home: House       Home Layout: Two level;Able to live on main level with bedroom/bathroom     Bathroom Shower/Tub: Tub/shower unit;Curtain Shower/tub characteristics: Engineer, building services: Standard     Home Equipment: None          Prior Functioning/Environment Level of Independence: Independent             OT Diagnosis: Other (comment) (syncope)   OT Problem List:     OT Treatment/Interventions:      OT Goals(Current goals can  be found in the care plan section) Acute Rehab OT Goals Patient Stated Goal: go home OT Goal Formulation: All assessment and education complete, DC therapy  OT Frequency:     Barriers to D/C:            Co-evaluation              End of Session    Activity Tolerance: Patient tolerated treatment well Patient left: in bed;with call bell/phone within reach;with family/visitor present   Time: [id number] OT Time Calculation (min): 9 min Charges:  OT General Charges $OT Visit: 1 Procedure OT Evaluation $OT Eval Low Complexity: 1 Procedure G-Codes: OT G-codes **NOT FOR INPATIENT CLASS** Functional Assessment Tool Used: Clinical judgement Functional Limitation: Self care Self Care Current Status (V4098(G8987): 0 percent impaired, limited or restricted Self Care Goal Status (J1914(G8988): 0 percent impaired, limited or restricted Self  Care Discharge Status (N8295(G8989): 0 percent impaired, limited or restricted   Aaron Dalton M.S., OTR/L Pager: [phone]  07/11/2016, 4:03 PM

## 2016-07-11 NOTE — Op Note (Signed)
[facility] Patient Name: Aaron Dalton Procedure Date : 07/11/2016 MRN: [id number] Attending MD: Aaron Dalton , MD Date of Birth: 15-May-1986 CSN: [id number] Age: 30 Admit Type: Inpatient Procedure:                Upper GI endoscopy Indications:              Melena Providers:                Aaron Shiver, MD, Aaron Sauce. Steele Berg, RN, Aaron Dalton, Technician Referring MD:              Medicines:                Fentanyl 50 micrograms IV, Midazolam 6 mg IV Complications:            No immediate complications. Estimated Blood Loss:     Estimated blood loss: none. Procedure:                Pre-Anesthesia Assessment:                           - Prior to the procedure, a History and Physical                            was performed, and patient medications and                            allergies were reviewed. The patient's tolerance of                            previous anesthesia was also reviewed. The risks                            and benefits of the procedure and the sedation                            options and risks were discussed with the patient.                            All questions were answered, and informed consent                            was obtained. Prior Anticoagulants: The patient has                            taken no previous anticoagulant or antiplatelet                            agents. ASA Grade Assessment: I - A normal, healthy                            patient. After reviewing the risks and benefits,  the patient was deemed in satisfactory condition to                            undergo the procedure.                           After obtaining informed consent, the endoscope was                            passed under direct vision. Throughout the                            procedure, the patient's blood pressure, pulse, and                            oxygen saturations were monitored  continuously. The                            EG-[id number] ([id number]) scope was introduced through the                            mouth, and advanced to the second part of duodenum.                            The upper GI endoscopy was accomplished without                            difficulty. The patient tolerated the procedure                            well. Scope In: Scope Out: Findings:      The examined esophagus was normal.      One superficial gastric ulcer with no stigmata of bleeding was found in       the prepyloric region of the stomach. The lesion was 7 mm in largest       dimension. Biopsies were taken with a cold forceps for Helicobacter       pylori testing.      Three cratered duodenal ulcers were found in the duodenal bulb. The       largest lesion was 6 mm in largest dimension. Impression:               - Normal esophagus.                           - Gastric ulcer with no stigmata of bleeding.                            Biopsied.                           - Multiple duodenal ulcers. Moderate Sedation:      . Recommendation:           - Resume regular diet.                           -  Continue present medications.                           - Await pathology results.                           - Treat with PPI. Check biopsy for H pylori. Avoid                            Nsaids. Procedure Code(s):        --- Professional ---                           [phone], Esophagogastroduodenoscopy, flexible,                            transoral; with biopsy, single or multiple Diagnosis Code(s):        --- Professional ---                           K25.9, Gastric ulcer, unspecified as acute or                            chronic, without hemorrhage or perforation                           K26.9, Duodenal ulcer, unspecified as acute or                            chronic, without hemorrhage or perforation                           K92.1, Melena (includes Hematochezia) CPT copyright 2016  American Medical Association. All rights reserved. The codes documented in this report are preliminary and upon coder review may  be revised to meet current compliance requirements. Aaron ShiverSalem F Jona Zappone, MD 07/11/2016 10:22:36 AM This report has been signed electronically. Number of Addenda: 0

## 2016-07-12 ENCOUNTER — Encounter (HOSPITAL_COMMUNITY): Payer: Self-pay | Admitting: Gastroenterology

## 2016-07-12 DIAGNOSIS — D62 Acute posthemorrhagic anemia: Secondary | ICD-10-CM

## 2016-07-12 DIAGNOSIS — S0181XA Laceration without foreign body of other part of head, initial encounter: Secondary | ICD-10-CM

## 2016-07-12 LAB — CBC
HCT: 26.1 % — ABNORMAL LOW (ref 39.0–52.0)
HEMOGLOBIN: 8.6 g/dL — AB (ref 13.0–17.0)
MCH: 29 pg (ref 26.0–34.0)
MCHC: 33 g/dL (ref 30.0–36.0)
MCV: 87.9 fL (ref 78.0–100.0)
Platelets: 188 10*3/uL (ref 150–400)
RBC: 2.97 MIL/uL — AB (ref 4.22–5.81)
RDW: 13.3 % (ref 11.5–15.5)
WBC: 8.4 10*3/uL (ref 4.0–10.5)

## 2016-07-12 MED ORDER — PANTOPRAZOLE SODIUM 40 MG PO TBEC: 40.0000 mg | DELAYED_RELEASE_TABLET | Freq: Two times a day (BID) | ORAL | 1 refills | Status: DC

## 2016-07-12 MED ORDER — POLYSACCHARIDE IRON COMPLEX 150 MG PO CAPS: 150.0000 mg | ORAL_CAPSULE | Freq: Every day | ORAL | 1 refills | Status: AC

## 2016-07-12 MED ORDER — SUCRALFATE 1 GM/10ML PO SUSP: 1.0000 g | Freq: Two times a day (BID) | ORAL | 0 refills | Status: AC

## 2016-07-12 MED FILL — PANTOPRAZOLE SOD DR 40 MG T: 40 | 30 days supply | Qty: 60 | Fill #0

## 2016-07-12 MED FILL — POLY-IRON 150 MG CAPSULE: 150 | 30 days supply | Qty: 30 | Fill #0

## 2016-07-12 MED FILL — CARAFATE 1 GM/10 ML SUSP: 1 | 21 days supply | Qty: 420 | Fill #0

## 2016-07-12 NOTE — Care Management Note (Signed)
Case Management Note  Patient Details  Name: Aaron Dalton MRN: [id number] Date of Birth: 31-Jul-1986  Subjective/Objective:                 Spoke to patient and SO in room They are from [place]. They stated [facility] up and stated they would make appointment there themselves. [facility]. Patient stated he would stop at admissions on his way out to discuss payment plans for stay. Independent, no further needs identified.   Action/Plan:  DC to home self care, Expected Discharge Date:  07/12/16               Expected Discharge Plan:  Home/Self Care  In-House Referral:  NA  Discharge planning Services  CM Consult, [facility]  Post Acute Care Choice:  NA Choice offered to:  NA  DME Arranged:    DME Agency:     HH Arranged:    HH Agency:     Status of Service:  Completed, signed off  If discussed at [employer] Meetings, dates discussed:    Additional Comments:  Lawerance SabalDebbie Meaghen Vecchiarelli, RN 07/12/2016, 11:13 AM

## 2016-07-12 NOTE — Progress Notes (Signed)
Pt discharged to home. Pt understands discharge instructions via teachback method, significant other in room during teaching. IV discontinued. Belongings with patient.

## 2016-07-12 NOTE — Discharge Summary (Signed)
Physician Discharge Summary  Aaron Dalton ZOX:[id number] DOB: [birth year] DOA: 07/10/2016  PCP: No PCP Per Patient  Admit date: 07/10/2016 Discharge date: 07/12/2016  Time spent: 35 minutes  Recommendations for Outpatient Follow-up:  1. Repeat CBC to follow HGB trend 2. Please follow results from EGD biopsy and H. Pil[place] testing   Discharge Diagnoses:  Active Problems:   GI bleed   Syncope and collapse   Leukocytosis   GIB (gastrointestinal bleeding)   Facial laceration   Acute blood loss anemia   Discharge Condition: stable and improved. Will follow up at transition clinic in 2 weeks and on as needed basis with GI.  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 He had not prior similar episodes. He reports black tarry stools over the last 5days. Hedenies recent chest pain on exertion, shortness of breath on minimal exertion, pre-syncopal episodes, or palpitations. Hehad not noticed any otherbleeding such as epistaxis, hematuria or hematochezia. Never had a colonoscopy or EGD. No history of Gastric or duodenal ulcers in the past. Reported use of excedrin for HA's . Heis not on antiplatelets agents. Hehad no prior history or diagnosis of GI cancer. Patient has never been evaluated for anemia by a hematologist. Hedenies any pica and eats a variety of diet. Henever donated blood or received blood transfusion. Hedenies risk factors for HIV or hepatitis.   Hospital Course:   1-GI bleed: upper GI bleed -status post EGD demonstrating multiple duodenal ulcers and also  gastric ulcers -no signs of active bleeding currently -biopsies taken and also testing for H. Pylori; will follow final results  -will continue PPI twice a day and Carafate -started on niferex for iron supplementation  -diet advance and well tolerated prior to discharge -no NSAID's  2-acute blood loss anemia -due to #1 -will recommend CBC at follow visit to check Hgb trend -started on niferex -no need for transfusion during this admission -at discharge Hgb 8.6 and climbing    3-syncope: -Likely due to GI bleed -neg troponin, no abnormalities on EKG or telemetry -neg orthostatic changes   4-leukocytosis -due to demargination stress -no signs of infection   5-right side forehead laceration -status post stiches in ED -looking good and w/o signs of infection   Procedures:  See below for x-ray reports   EGD: 07/11/16 - Normal esophagus. - Gastric ulcer with no stigmata of bleeding. Biopsied. - Multiple duodenal ulcers.   Consultations:  GI (Dr. Evette Cristal)  Discharge Exam: Vitals:   07/11/16 2242 07/12/16 0631  BP: 120/72 122/67  Pulse: 76 87  Resp: 16 16  Temp: 97.7 F (36.5 C) 97.9 F (36.6 C)    General:  Afebrile, no CP, no lightheadedness, dizziness and/or palpitations. Reports no further black stools. Tolerating diet w/o problems.  Cardiovascular:S1 and S2, no abnormalities on telemetry. No murmurs, no gallops   Respiratory: CTA bilaterally  Abdomen: soft, NT, ND, positive BS  Musculoskeletal: no edema or cyanosis   Skin: right eyebrow/forehead with fresh cutting wound and stiches in place; no signs of infection.    Discharge Instructions   Discharge Instructions  Discharge instructions    Complete by:  As directed   Avoid the use of NSAID's (iburpofen, advil, motrin, aleve, naproxen, goody powders, Excedrin, etc....) Use tylenol as needed for pain Take medications as prescribed Increase fiber intake and keep yourself well hydrated  Follow  up with GI (Dr. Evette CristalGanem on as needed basis) and follow up as instructed with transition clinic.     Current Discharge Medication List    START taking these medications   Details  iron polysaccharides (NIFEREX) 150 MG capsule Take 1 capsule (150 mg total) by mouth daily. Qty: 30 capsule, Refills: 1    pantoprazole (PROTONIX) 40 MG tablet Take 1 tablet (40 mg total) by mouth 2 (two) times daily. Qty: 60 tablet, Refills: 1    sucralfate (CARAFATE) 1 GM/10ML suspension Take 10 mLs (1 g total) by mouth 2 (two) times daily. Qty: 420 mL, Refills: 0      CONTINUE these medications which have NOT CHANGED   Details  loratadine-pseudoephedrine (CLARITIN-D 12-HOUR) 5-120 MG tablet Take 1 tablet by mouth 2 (two) times daily as needed for allergies.      STOP taking these medications     aspirin-acetaminophen-caffeine (EXCEDRIN MIGRAINE) 250-250-65 MG tablet        No Known Allergies Follow-up Information    [facility]. Call in 2 week(s).   Why:  contact office to set up appointment for hospital follow up and to establish care with primary care physician. Contact information: [address] [place] [phone]       Gwenevere AbbotSAM F GANEM, MD Follow up today.   Specialty:  Gastroenterology Why:  follow up with gastroenterologist on as needed basis  Contact information: [address] [place] [address] [phone]           The results of significant diagnostics from this hospitalization (including imaging, microbiology, ancillary and laboratory) are listed below for reference.    Significant Diagnostic Studies: Ct Head Wo Contrast  Result Date: 07/10/2016 CLINICAL DATA:  Fall today EXAM: CT HEAD WITHOUT CONTRAST TECHNIQUE: Contiguous axial images were obtained from the base of the skull through the vertex without intravenous contrast. COMPARISON:  None. FINDINGS: Brain: No intracranial hemorrhage, mass  effect or midline shift. No acute cortical infarction. No mass lesion is noted on this unenhanced scan. Vascular: No hyperdense vessel or unexpected calcification. Skull: Normal. Negative for fracture or focal lesion. There is skin irregularity probable laceration right frontal supraorbital scalp. Sinuses/Orbits: No acute finding. Other: None. IMPRESSION: No acute intracranial abnormality. Probable skin laceration right frontal supraorbital region. Electronically Signed   By: Natasha MeadLiviu  Pop M.D.   On: 07/10/2016 11:43    Microbiology: No results found for this or any previous visit (from the past 240 hour(s)).   Labs: Basic Metabolic Panel:  Recent Labs Lab 07/10/16 1050 07/11/16 0622  NA 136 141  K 4.3 3.8  CL 107 112*  CO2 24 24  GLUCOSE 112* 100*  BUN 35* 14  CREATININE 0.92 0.98  CALCIUM 8.8* 8.0*   Liver Function Tests:  Recent Labs Lab 07/10/16 1050 07/11/16 0622  AST 28 21  ALT 31 25  ALKPHOS 54 45  BILITOT 0.6 0.5  PROT 6.3* 4.8*  ALBUMIN 3.5 2.6*   CBC:  Recent Labs Lab 07/10/16 1050 07/10/16 1532 07/10/16 2314 07/11/16 0622 07/11/16 0853 07/12/16 0443  WBC 13.4*  --   --  7.8  --  8.4  NEUTROABS 9.0*  --   --   --   --   --  HGB 10.4* 9.1* 8.3* 8.0* 8.4* 8.6*  HCT 30.2* 26.9* 24.3* 24.2* 25.6* 26.1*  MCV 86.5  --   --  87.7  --  87.9  PLT 206  --   --  170  --  188   Cardiac Enzymes:  Recent Labs Lab 07/10/16 1050  TROPONINI <0.03   Signed:  Vassie Loll MD.  [facility] 07/12/2016, 9:19 AM

## 2016-07-15 ENCOUNTER — Encounter: Payer: Self-pay | Admitting: Internal Medicine

## 2016-07-15 ENCOUNTER — Ambulatory Visit: Payer: Self-pay | Attending: Internal Medicine | Admitting: Internal Medicine

## 2016-07-15 VITALS — BP 111/77 | HR 78 | Temp 98.1°F | Ht 64.0 in | Wt 189.6 lb

## 2016-07-15 DIAGNOSIS — K279 Peptic ulcer, site unspecified, unspecified as acute or chronic, without hemorrhage or perforation: Secondary | ICD-10-CM

## 2016-07-15 DIAGNOSIS — Z23 Encounter for immunization: Secondary | ICD-10-CM

## 2016-07-15 DIAGNOSIS — D62 Acute posthemorrhagic anemia: Secondary | ICD-10-CM

## 2016-07-15 DIAGNOSIS — K269 Duodenal ulcer, unspecified as acute or chronic, without hemorrhage or perforation: Secondary | ICD-10-CM

## 2016-07-15 DIAGNOSIS — E669 Obesity, unspecified: Secondary | ICD-10-CM

## 2016-07-15 DIAGNOSIS — B9681 Helicobacter pylori [H. pylori] as the cause of diseases classified elsewhere: Secondary | ICD-10-CM

## 2016-07-15 LAB — CBC WITH DIFFERENTIAL/PLATELET
BASOS ABS: 0 {cells}/uL (ref 0–200)
Basophils Relative: 0 %
EOS PCT: 5 %
Eosinophils Absolute: 365 cells/uL (ref 15–500)
HCT: 27.5 % — ABNORMAL LOW (ref 38.5–50.0)
Hemoglobin: 9.3 g/dL — ABNORMAL LOW (ref 13.2–17.1)
Lymphocytes Relative: 26 %
Lymphs Abs: 1898 cells/uL (ref 850–3900)
MCH: 28.7 pg (ref 27.0–33.0)
MCHC: 33.8 g/dL (ref 32.0–36.0)
MCV: 84.9 fL (ref 80.0–100.0)
MONOS PCT: 9 %
MPV: 10.8 fL (ref 7.5–12.5)
Monocytes Absolute: 657 cells/uL (ref 200–950)
NEUTROS ABS: 4380 {cells}/uL (ref 1500–7800)
Neutrophils Relative %: 60 %
Platelets: 323 10*3/uL (ref 140–400)
RBC: 3.24 MIL/uL — AB (ref 4.20–5.80)
RDW: 13.2 % (ref 11.0–15.0)
WBC: 7.3 10*3/uL (ref 3.8–10.8)

## 2016-07-15 MED ORDER — AMOXICILLIN 500 MG PO CAPS: 500.0000 mg | ORAL_CAPSULE | Freq: Three times a day (TID) | ORAL | 0 refills | Status: AC

## 2016-07-15 MED ORDER — CLARITHROMYCIN 500 MG PO TABS: 500.0000 mg | ORAL_TABLET | Freq: Two times a day (BID) | ORAL | 0 refills | Status: AC

## 2016-07-15 MED ORDER — AMOXICILL-CLARITHRO-LANSOPRAZ PO MISC: Freq: Two times a day (BID) | ORAL | 0 refills | Status: DC

## 2016-07-15 MED ORDER — PANTOPRAZOLE SODIUM 40 MG PO TBEC: 40.0000 mg | DELAYED_RELEASE_TABLET | Freq: Two times a day (BID) | ORAL | 1 refills | Status: AC

## 2016-07-15 MED FILL — CLARITHROMYCIN 500 MG TAB: 500 | 14 days supply | Qty: 28 | Fill #0

## 2016-07-15 MED FILL — AMOXICILLIN 500 MG CAPSULE: 500 | 14 days supply | Qty: 42 | Fill #0

## 2016-07-15 NOTE — Progress Notes (Signed)
Forehead sutures present for 6 days- removal ?

## 2016-07-15 NOTE — Progress Notes (Signed)
Aaron Dalton, is a 30 y.o. male  ZOX:[id number]:[id number]  [id number]:[id number]  DOB - [birth year]  Chief Complaint  Patient presents with  . Hospitalization Follow-up    ulcer        Subjective:   Aaron Dalton is a 30 y.o. male here today for a follow up visit, sp hospitalization 9/9 - 07/12/16 for syncope 2nd to anemia.  egd found multiple ulcers, trx w/ ppi and carafate. Back today for f/u. Had small laceration right eyelid after syncopal event.  No c/o, no abd pain, tol po.   Patient has No headache, No chest pain, No abdominal pain - No Nausea, No new weakness tingling or numbness, No Cough - SOB.  Here w/ gf.  No problems updated.  ALLERGIES: No Known Allergies  PAST MEDICAL HISTORY: History reviewed. No pertinent past medical history.  MEDICATIONS AT HOME: Prior to Admission medications   Medication Sig Start Date End Date Taking? Authorizing Provider  iron polysaccharides (NIFEREX) 150 MG capsule Take 1 capsule (150 mg total) by mouth daily. 07/12/16  Yes Vassie Lollarlos Madera, MD  loratadine-pseudoephedrine (CLARITIN-D 12-HOUR) 5-120 MG tablet Take 1 tablet by mouth 2 (two) times daily as needed for allergies.   Yes Historical Provider, MD  sucralfate (CARAFATE) 1 GM/10ML suspension Take 10 mLs (1 g total) by mouth 2 (two) times daily. 07/12/16  Yes Vassie Lollarlos Madera, MD  amoxicillin-clarithromycin-lansoprazole [facility](PREVPAC) combo pack Take by mouth 2 (two) times daily. Follow package directions. 07/15/16   Pete Glatterawn T Narcisa Ganesh, MD     Objective:   Vitals:   07/15/16 1054  BP: 111/77  Pulse: 78  Temp: 98.1 F (36.7 C)  TempSrc: Oral  SpO2: 100%  Weight: 189 lb 9.6 oz (86 kg)  Height: 5\' 4"  (1.626 m)    Exam General appearance : Awake, alert, not in any distress. Speech Clear. Not toxic looking, obese, pleasant HEENT: Atraumatic and Normocephalic, pupils equally reactive to light. Healing laceration /sutures above right eyelid, no signs of edema/inflammation. Neck: supple, no JVD.    Chest:Good air entry bilaterally, no added sounds. CVS: S1 S2 regular, no murmurs/gallups or rubs. Abdomen: Bowel sounds active, obese, Non tender and not distended with no gaurding, rigidity or rebound. Extremities: B/L Lower Ext shows no edema, both legs are warm to touch Neurology: Awake alert, and oriented X 3, CN II-XII grossly intact, Non focal Skin:No Rash  Data Review No results found for: HGBA1C  No flowsheet data found.  Ct head 07/10/16 CLINICAL DATA:  Fall today  EXAM: CT HEAD WITHOUT CONTRAST  TECHNIQUE: Contiguous axial images were obtained from the base of the skull through the vertex without intravenous contrast.  COMPARISON:  None.  FINDINGS: Brain: No intracranial hemorrhage, mass effect or midline shift. No acute cortical infarction. No mass lesion is noted on this unenhanced scan.  Vascular: No hyperdense vessel or unexpected calcification.  Skull: Normal. Negative for fracture or focal lesion. There is skin irregularity probable laceration right frontal supraorbital scalp.  Sinuses/Orbits: No acute finding.  Other: None.  IMPRESSION: No acute intracranial abnormality. Probable skin laceration right frontal supraorbital region.   Electronically Signed   By: Natasha MeadLiviu  Pop M.D.   On: 07/10/2016 11:43  - surg path 07/11/16  Diagnosis Stomach, biopsy, Antral - CHRONIC ACTIVE GASTRITIS WITH HELICOBACTER PYLORI. - NO INTESTINAL METAPLASIA, DYSPLASIA, OR MALIGNANCY. Microscopic Comment A Warthin-Starry stain is performed to determine the possibility of the presence of Helicobacter pylori. Organisms of Helicobacter pylori are identified on the Warthin-Starry stain. Valinda HoarJULIA MANNY MD Pathologist,  Electronic Signature   07/11/16 egd - Normal esophagus. - Gastric ulcer with no stigmata of bleeding. Biopsied. - Multiple duodenal ulcers.  Assessment & Plan   1. Acute blood loss anemia From gi bleed,  - CBC with Differential - recd  continue iron  2. Flu vaccine need - Flu Vaccine QUAD 36+ mos PF IM (Fluarix & Fluzone Quad PF)  3. H pylori ulcer, w/ associated gi bleed, multiple duodenal ulcers. Chg ppi to Prevpac.  4. Sutures Come back next week for removal.  5. Obesity  Red low carb diet, increase exercise, info provided     Patient have been counseled extensively about nutrition and exercise  Return in about 1 week (around 07/22/2016) for suture removal.  The patient was given clear instructions to go to ER or return to medical center if symptoms don't improve, worsen or new problems develop. The patient verbalized understanding. The patient was told to call to get lab results if they haven't heard anything in the next week.   This note has been created with Education officer, environmental. Any transcriptional errors are unintentional.   Pete Glatter, MD, MBA/MHA [facility] [place], [place] [phone]   07/15/2016, 11:52 AM

## 2016-07-15 NOTE — Patient Instructions (Addendum)
Weight loss,  Recd low carbohydrate diet, <50gm /daily goal Good site: www.dietdoctor.com Book. The Obesity Code, by Dr Wylene Simmer   -  Exercising to Lose Weight Exercising can help you to lose weight. In order to lose weight through exercise, you need to do vigorous-intensity exercise. You can tell that you are exercising with vigorous intensity if you are breathing very hard and fast and cannot hold a conversation while exercising. Moderate-intensity exercise helps to maintain your current weight. You can tell that you are exercising at a moderate level if you have a higher heart rate and faster breathing, but you are still able to hold a conversation. HOW OFTEN SHOULD I EXERCISE? Choose an activity that you enjoy and set realistic goals. Your health care provider can help you to make an activity plan that works for you. Exercise regularly as directed by your health care provider. This may include:  Doing resistance training twice each week, such as:  Push-ups.  Sit-ups.  Lifting weights.  Using resistance bands.  Doing a given intensity of exercise for a given amount of time. Choose from these options:  150 minutes of moderate-intensity exercise every week.  75 minutes of vigorous-intensity exercise every week.  A mix of moderate-intensity and vigorous-intensity exercise every week. Children, pregnant women, people who are out of shape, people who are overweight, and older adults may need to consult a health care provider for individual recommendations. If you have any sort of medical condition, be sure to consult your health care provider before starting a new exercise program. WHAT ARE SOME ACTIVITIES THAT CAN HELP ME TO LOSE WEIGHT?   Walking at a rate of at least 4.5 miles an hour.  Jogging or running at a rate of 5 miles per hour.  Biking at a rate of at least 10 miles per hour.  Lap swimming.  Roller-skating or in-line skating.  Cross-country skiing.  Vigorous  competitive sports, such as football, basketball, and soccer.  Jumping rope.  Aerobic dancing. HOW CAN I BE MORE ACTIVE IN MY DAY-TO-DAY ACTIVITIES?  Use the stairs instead of the elevator.  Take a walk during your lunch break.  If you drive, park your car farther away from work or school.  If you take public transportation, get off one stop early and walk the rest of the way.  Make all of your phone calls while standing up and walking around.  Get up, stretch, and walk around every 30 minutes throughout the day. WHAT GUIDELINES SHOULD I FOLLOW WHILE EXERCISING?  Do not exercise so much that you hurt yourself, feel dizzy, or get very short of breath.  Consult your health care provider prior to starting a new exercise program.  Wear comfortable clothes and shoes with good support.  Drink plenty of water while you exercise to prevent dehydration or heat stroke. Body water is lost during exercise and must be replaced.  Work out until you breathe faster and your heart beats faster.   This information is not intended to replace advice given to you by your health care provider. Make sure you discuss any questions you have with your health care provider.   Document Released: 11/20/2010 Document Revised: 11/08/2014 Document Reviewed: 03/21/2014 Elsevier Interactive Patient Education 2016 [employer].   What Is H. pylori?     Helicobacter pylori (H. pylori) is a type of bacteria. These germs can enter your body and live in your digestive tract. After many years, they can cause sores, called ulcers, in the  lining of your stomach or the upper part of your small intestine. For some people, an infection can lead to stomach cancer. Infection with H. pylori is common. About two-thirds of the world's population has it in their bodies. For most people, it doesn't cause ulcers or any other symptoms. If you do have problems, there are medicines that can kill the germs and help sores  heal. As more of the world gets access to clean water and sanitation, fewer people than before are getting the bacteria. With good health habits, you can protect yourself and your children from H. pylori. How H. pylori Makes You Sick For decades, doctors thought people got ulcers from stress, spicy foods, smoking, or other lifestyle habits. But when scientists discovered H. pylori in 1982, they found that the germs were the cause of most stomach ulcers. Continue Reading Below  After H. pylori enters your body, it attacks the lining of your stomach, which usually protects you from the acid your body uses to digest food. Once the bacteria have done enough damage, acid can get through the lining, which leads to ulcers. These may bleed, cause infections, or keep food from moving through your digestive tract. You can get H. pylori from food, water, or utensils. It's more common in countries or communities that lack clean water or good sewage systems. You can also pick up the bacteria through contact with the saliva or other body fluids of infected people. Many people get H. pylori during childhood, but adults can get it, too. The germs live in the body for years before symptoms start, but most people who have it will never get ulcers. Doctors aren't sure why only some people get ulcers after an infection. Symptoms If you have an ulcer, you may feel a dull or burning pain in your belly. It may come and go, but you'll probably feel it most when your stomach is empty, such as between meals or in the middle of the night. It can last for a few minutes or for hours. You may feel better after you eat, drink milk, or take an antacid.    Food Choices for Peptic Ulcer Disease When you have peptic ulcer disease, the foods you eat and your eating habits are very important. Choosing the right foods can help ease the discomfort of peptic ulcer disease. WHAT GENERAL GUIDELINES DO I NEED TO FOLLOW?  Choose fruits,  vegetables, whole grains, and low-fat meat, fish, and poultry.   Keep a food diary to identify foods that cause symptoms.  Avoid foods that cause irritation or pain. These may be different for different people.  Eat frequent small meals instead of three large meals each day. The pain may be worse when your stomach is empty.  Avoid eating close to bedtime. WHAT FOODS ARE NOT RECOMMENDED? The following are some foods and drinks that may worsen your symptoms:  Black, white, and red pepper.  Hot sauce.  Chili peppers.  Chili powder.  Chocolate and cocoa.   Alcohol.  Tea, coffee, and cola (regular and decaffeinated). The items listed above may not be a complete list of foods and beverages to avoid. Contact your dietitian for more information.   This information is not intended to replace advice given to you by your health care provider. Make sure you discuss any questions you have with your health care provider.   Document Released: 01/10/2012 Document Revised: 10/23/2013 Document Reviewed: 08/22/2013 Elsevier Interactive Patient Education 2016 Elsevier Inc.   - Influenza Virus  Vaccine injection (Fluarix) What is this medicine? INFLUENZA VIRUS VACCINE (in floo EN zuh VAHY ruhs vak SEEN) helps to reduce the risk of getting influenza also known as the flu. This medicine may be used for other purposes; ask your health care provider or pharmacist if you have questions. What should I tell my health care provider before I take this medicine? They need to know if you have any of these conditions: -bleeding disorder like hemophilia -fever or infection -Guillain-Barre syndrome or other neurological problems -immune system problems -infection with the human immunodeficiency virus (HIV) or AIDS -low blood platelet counts -multiple sclerosis -an unusual or allergic reaction to influenza virus vaccine, eggs, chicken proteins, latex, gentamicin, other medicines, foods, dyes or  preservatives -pregnant or trying to get pregnant -breast-feeding How should I use this medicine? This vaccine is for injection into a muscle. It is given by a health care professional. A copy of Vaccine Information Statements will be given before each vaccination. Read this sheet carefully each time. The sheet may change frequently. Talk to your pediatrician regarding the use of this medicine in children. Special care may be needed. Overdosage: If you think you have taken too much of this medicine contact a poison control center or emergency room at once. NOTE: This medicine is only for you. Do not share this medicine with others. What if I miss a dose? This does not apply. What may interact with this medicine? -chemotherapy or radiation therapy -medicines that lower your immune system like etanercept, anakinra, infliximab, and adalimumab -medicines that treat or prevent blood clots like warfarin -phenytoin -steroid medicines like prednisone or cortisone -theophylline -vaccines This list may not describe all possible interactions. Give your health care provider a list of all the medicines, herbs, non-prescription drugs, or dietary supplements you use. Also tell them if you smoke, drink alcohol, or use illegal drugs. Some items may interact with your medicine. What should I watch for while using this medicine? Report any side effects that do not go away within 3 days to your doctor or health care professional. Call your health care provider if any unusual symptoms occur within 6 weeks of receiving this vaccine. You may still catch the flu, but the illness is not usually as bad. You cannot get the flu from the vaccine. The vaccine will not protect against colds or other illnesses that may cause fever. The vaccine is needed every year. What side effects may I notice from receiving this medicine? Side effects that you should report to your doctor or health care professional as soon as  possible: -allergic reactions like skin rash, itching or hives, swelling of the face, lips, or tongue Side effects that usually do not require medical attention (report to your doctor or health care professional if they continue or are bothersome): -fever -headache -muscle aches and pains -pain, tenderness, redness, or swelling at site where injected -weak or tired This list may not describe all possible side effects. Call your doctor for medical advice about side effects. You may report side effects to FDA at 1-800-FDA-1088. Where should I keep my medicine? This vaccine is only given in a clinic, pharmacy, doctor's office, or other health care setting and will not be stored at home. NOTE: This sheet is a summary. It may not cover all possible information. If you have questions about this medicine, talk to your doctor, pharmacist, or health care provider.    2016, Elsevier/Gold Standard. (2008-05-15 09:30:40)

## 2016-07-23 ENCOUNTER — Telehealth: Payer: Self-pay

## 2016-07-23 NOTE — Telephone Encounter (Signed)
Contacted pt to go over lab results pt is aware of results and is aware to continue to take his iron pills

## 2017-02-09 IMAGING — CT CT HEAD W/O CM
4 series · 16 of 47 positions shown, 18 images · non-contrast
Comparison: None.

CLINICAL DATA: Fall today

EXAM:
CT HEAD WITHOUT CONTRAST
TECHNIQUE: Contiguous axial images were obtained from the base of the skull
through the vertex without intravenous contrast.

[Series 2: head wo · axial · 0.49mm/px · z∈[-177,-57]mm · 7 of 32 slices shown, 9 images]
[im 4/32  brain]
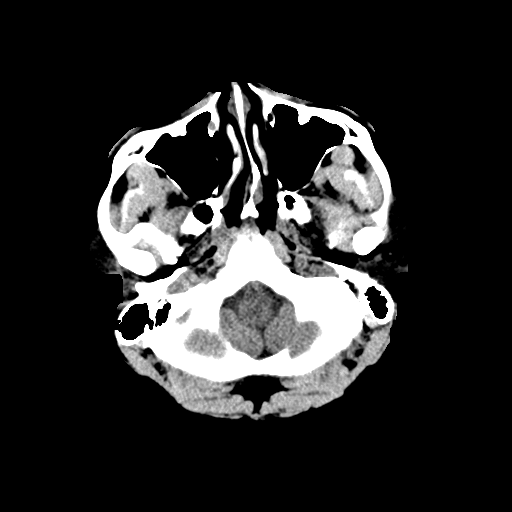
[im 4/32  bone]
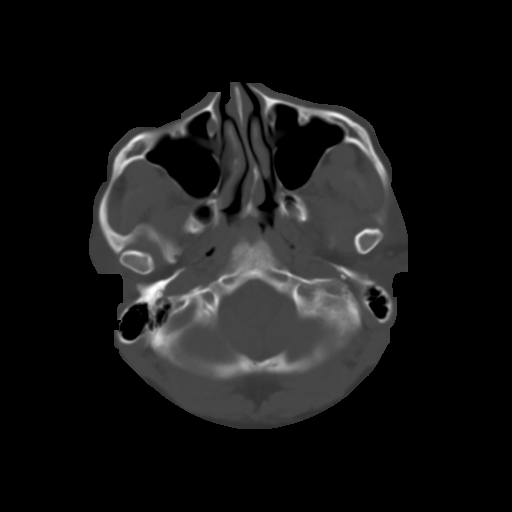
[im 8/32  brain]
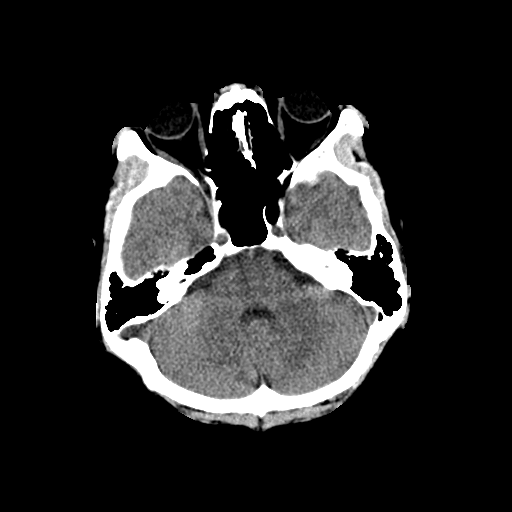
[im 12/32  brain]
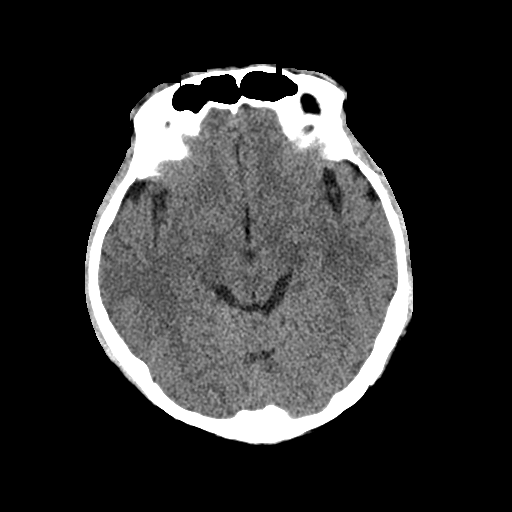
[im 16/32  brain]
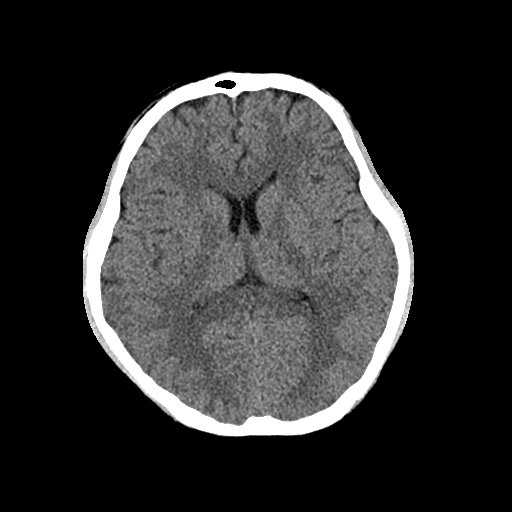
[im 20/32  brain]
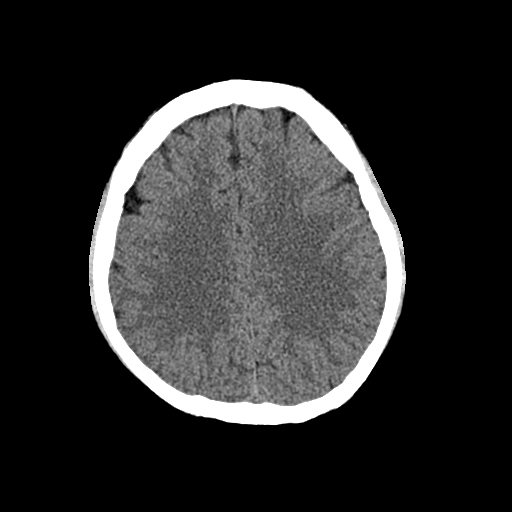
[im 20/32  bone]
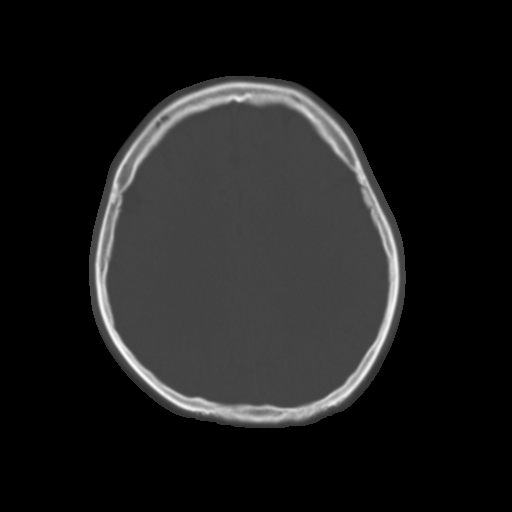
[im 24/32  brain]
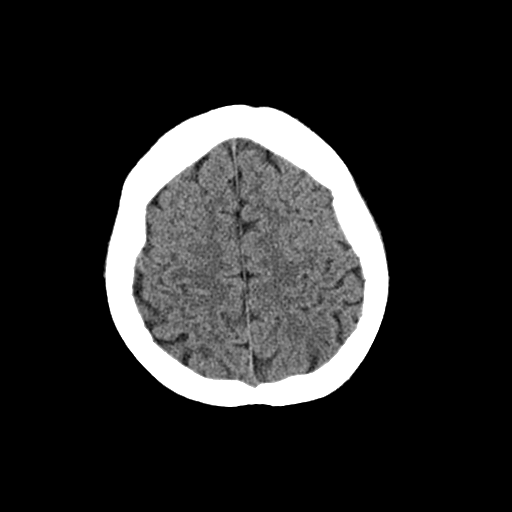
[im 28/32  brain]
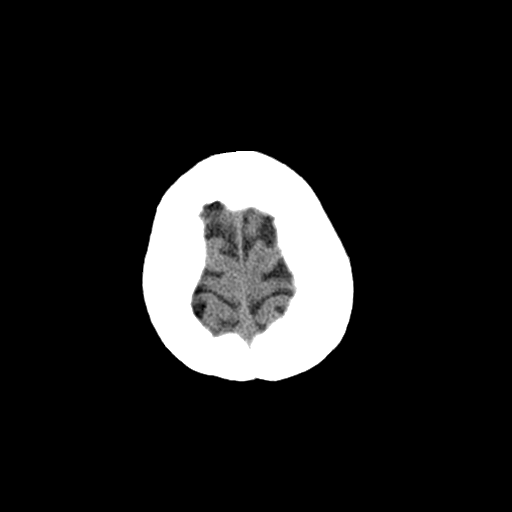

[Series 3: head bone · axial · 0.49mm/px · z∈[-178,-146]mm · 3 of 80 slices shown]
[im 8/80  bone]
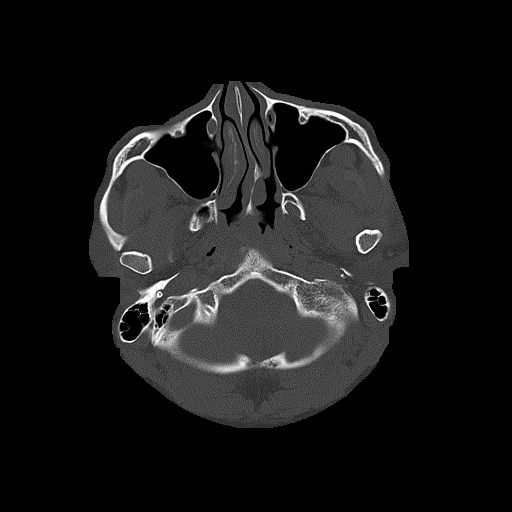
[im 16/80  bone]
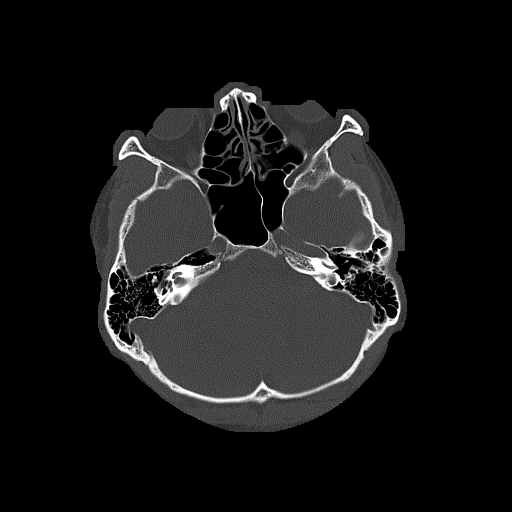
[im 24/80  bone]
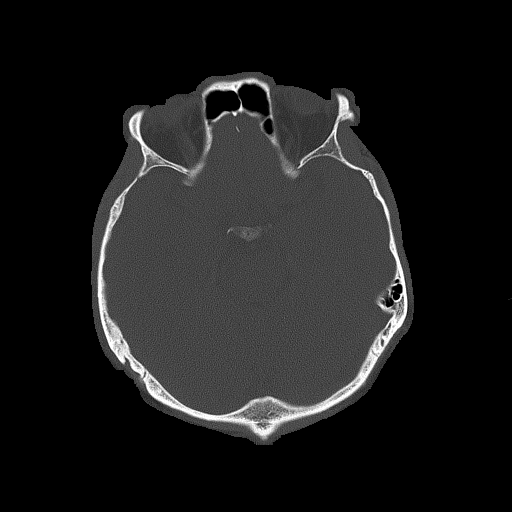

[Series 4: cor soft · coronal · 0.38mm/px · 3 of 63 slices shown]
[im 21/63  brain]
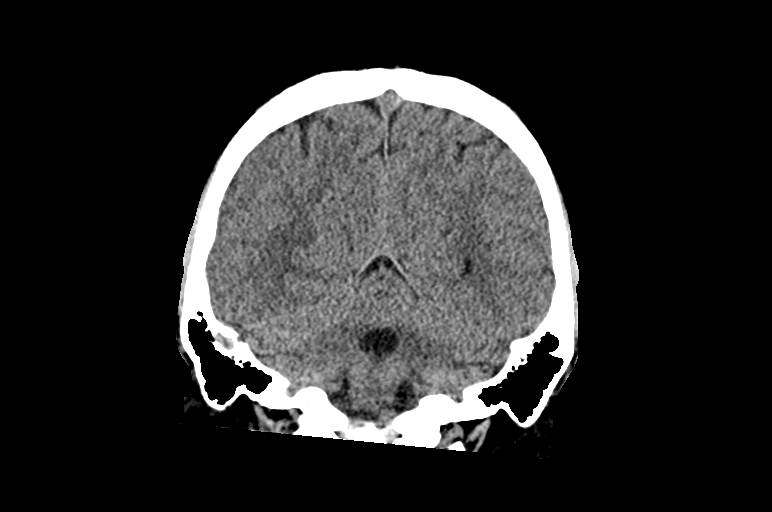
[im 28/63  brain]
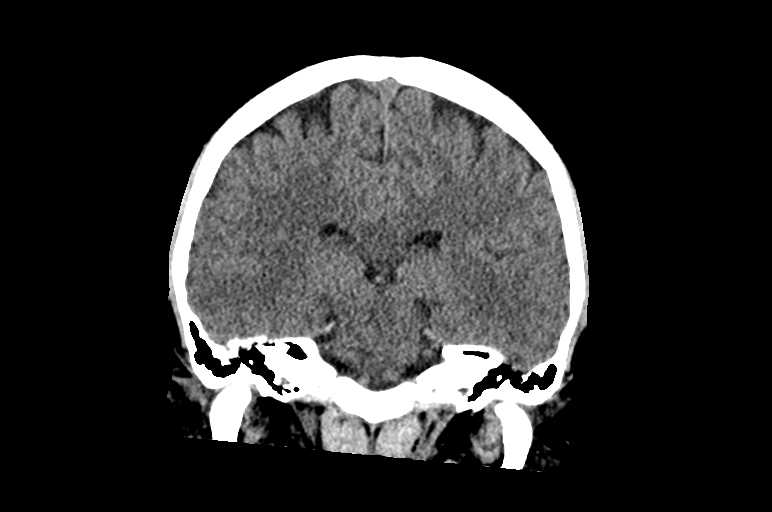
[im 35/63  brain]
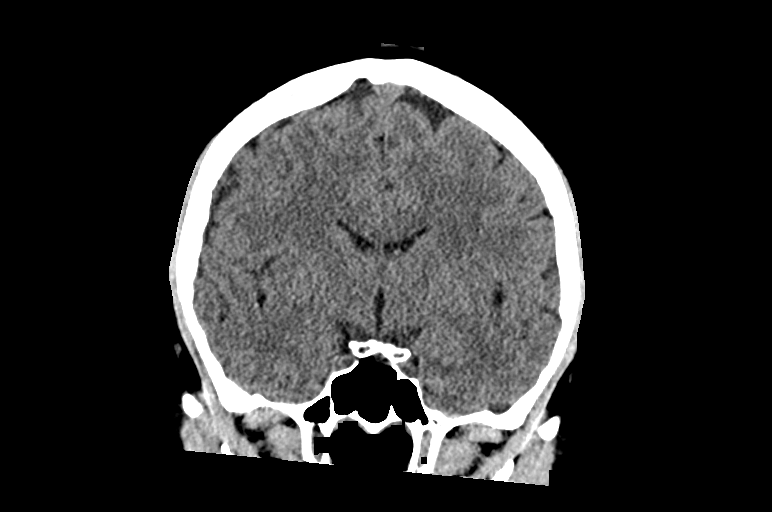

[Series 5: sag soft · sagittal · 0.38mm/px · 3 of 59 slices shown]
[im 20/59  brain]
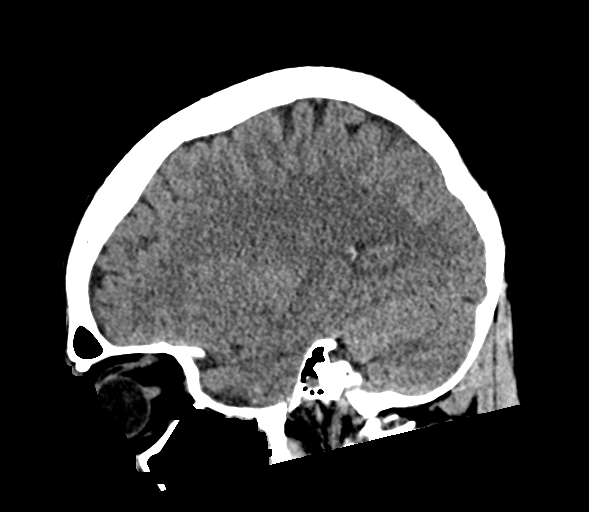
[im 30/59  brain]
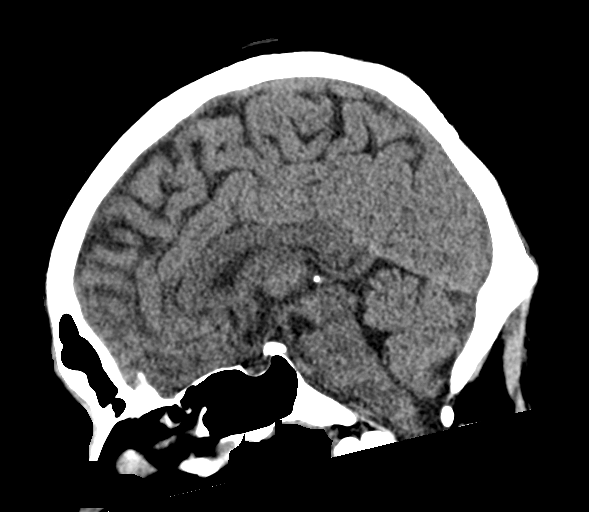
[im 39/59  brain]
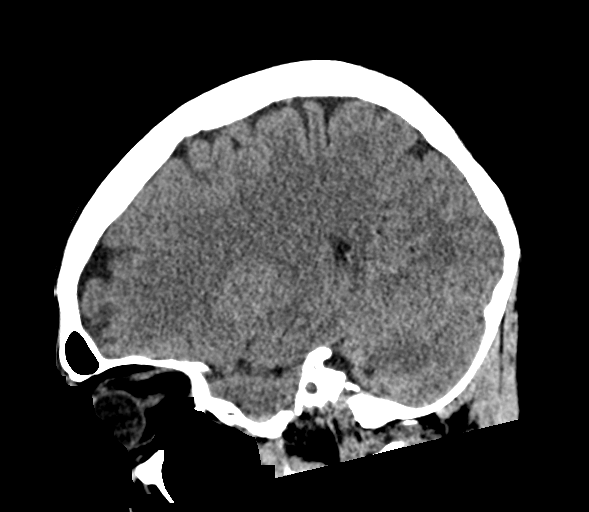

[16 of 47 positions shown; findings below may reference images not displayed]

FINDINGS: Brain: No intracranial hemorrhage, mass effect or midline shift. No
acute cortical infarction. No mass lesion is noted on this
unenhanced scan.

Vascular: No hyperdense vessel or unexpected calcification.

Skull: Normal. Negative for fracture or focal lesion. There is skin
irregularity probable laceration right frontal supraorbital scalp.

Sinuses/Orbits: No acute finding.

Other: None.
IMPRESSION: No acute intracranial abnormality. Probable skin laceration right
frontal supraorbital region.
